# Patient Record
Sex: Male | Born: 1975 | Race: White | Hispanic: No | Marital: Single
Health system: Southern US, Community
[De-identification: ages and names within clinical notes are randomized; demographics above are authoritative.]

---

## 1998-09-04 ENCOUNTER — Emergency Department (HOSPITAL_COMMUNITY): Admission: EM | Admit: 1998-09-04 | Discharge: 1998-09-04 | Payer: Self-pay

## 1998-09-06 ENCOUNTER — Encounter: Admission: RE | Admit: 1998-09-06 | Discharge: 1998-09-06 | Payer: Self-pay | Admitting: *Deleted

## 2000-02-27 ENCOUNTER — Encounter: Payer: Self-pay | Admitting: Emergency Medicine

## 2000-02-27 ENCOUNTER — Emergency Department (HOSPITAL_COMMUNITY): Admission: EM | Admit: 2000-02-27 | Discharge: 2000-02-27 | Payer: Self-pay | Admitting: Emergency Medicine

## 2000-06-17 ENCOUNTER — Emergency Department (HOSPITAL_COMMUNITY): Admission: EM | Admit: 2000-06-17 | Discharge: 2000-06-17 | Payer: Self-pay | Admitting: *Deleted

## 2000-06-17 ENCOUNTER — Encounter: Payer: Self-pay | Admitting: Emergency Medicine

## 2000-06-24 ENCOUNTER — Ambulatory Visit (HOSPITAL_BASED_OUTPATIENT_CLINIC_OR_DEPARTMENT_OTHER): Admission: RE | Admit: 2000-06-24 | Discharge: 2000-06-24 | Payer: Self-pay | Admitting: Orthopedic Surgery

## 2000-09-29 ENCOUNTER — Inpatient Hospital Stay (HOSPITAL_COMMUNITY): Admission: EM | Admit: 2000-09-29 | Discharge: 2000-10-01 | Payer: Self-pay | Admitting: Emergency Medicine

## 2000-09-29 ENCOUNTER — Encounter: Payer: Self-pay | Admitting: Anesthesiology

## 2000-09-30 ENCOUNTER — Encounter: Payer: Self-pay | Admitting: General Surgery

## 2006-01-01 ENCOUNTER — Emergency Department (HOSPITAL_COMMUNITY): Admission: EM | Admit: 2006-01-01 | Discharge: 2006-01-01 | Payer: Self-pay | Admitting: *Deleted

## 2008-05-30 IMAGING — CR DG CHEST 2V
2 series · 2 of 2 positions shown · non-contrast
Comparison: None

CLINICAL DATA: Chest pain, fever

CHEST - 2 VIEW:

[view not recorded (1 of 2)]
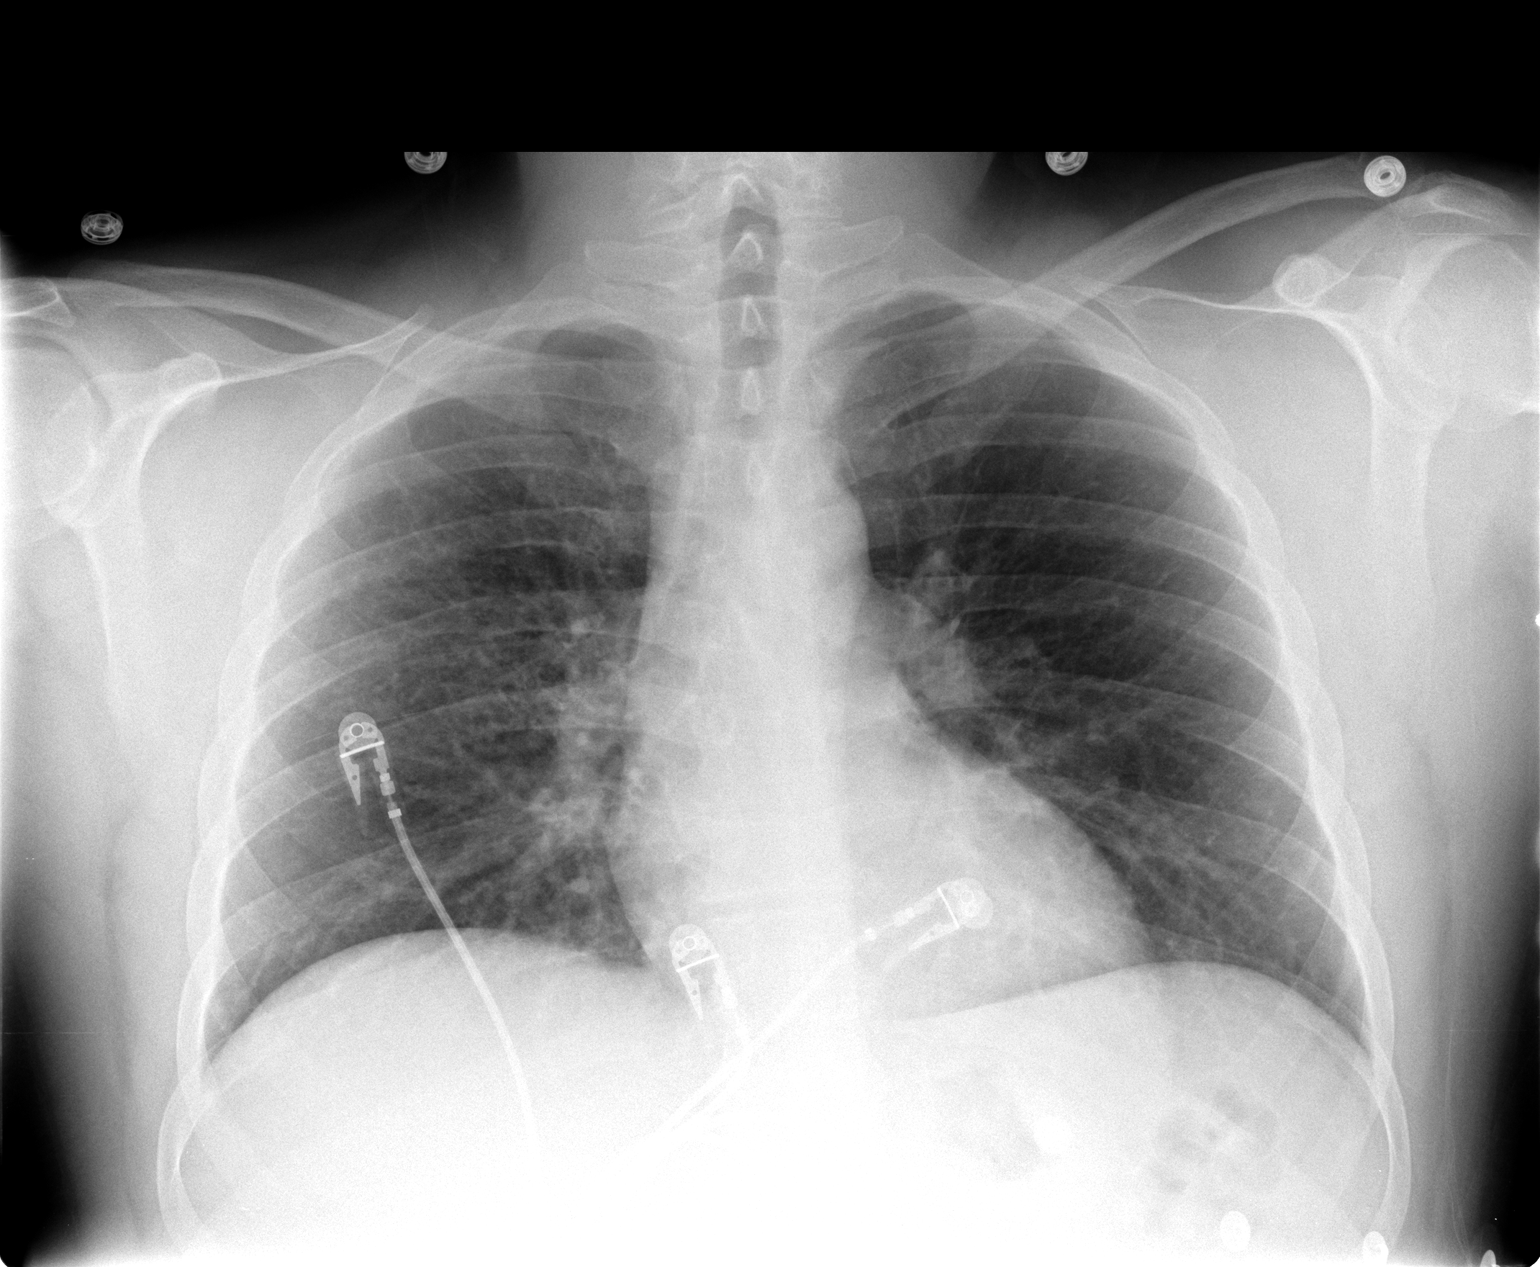

[view not recorded (2 of 2)]
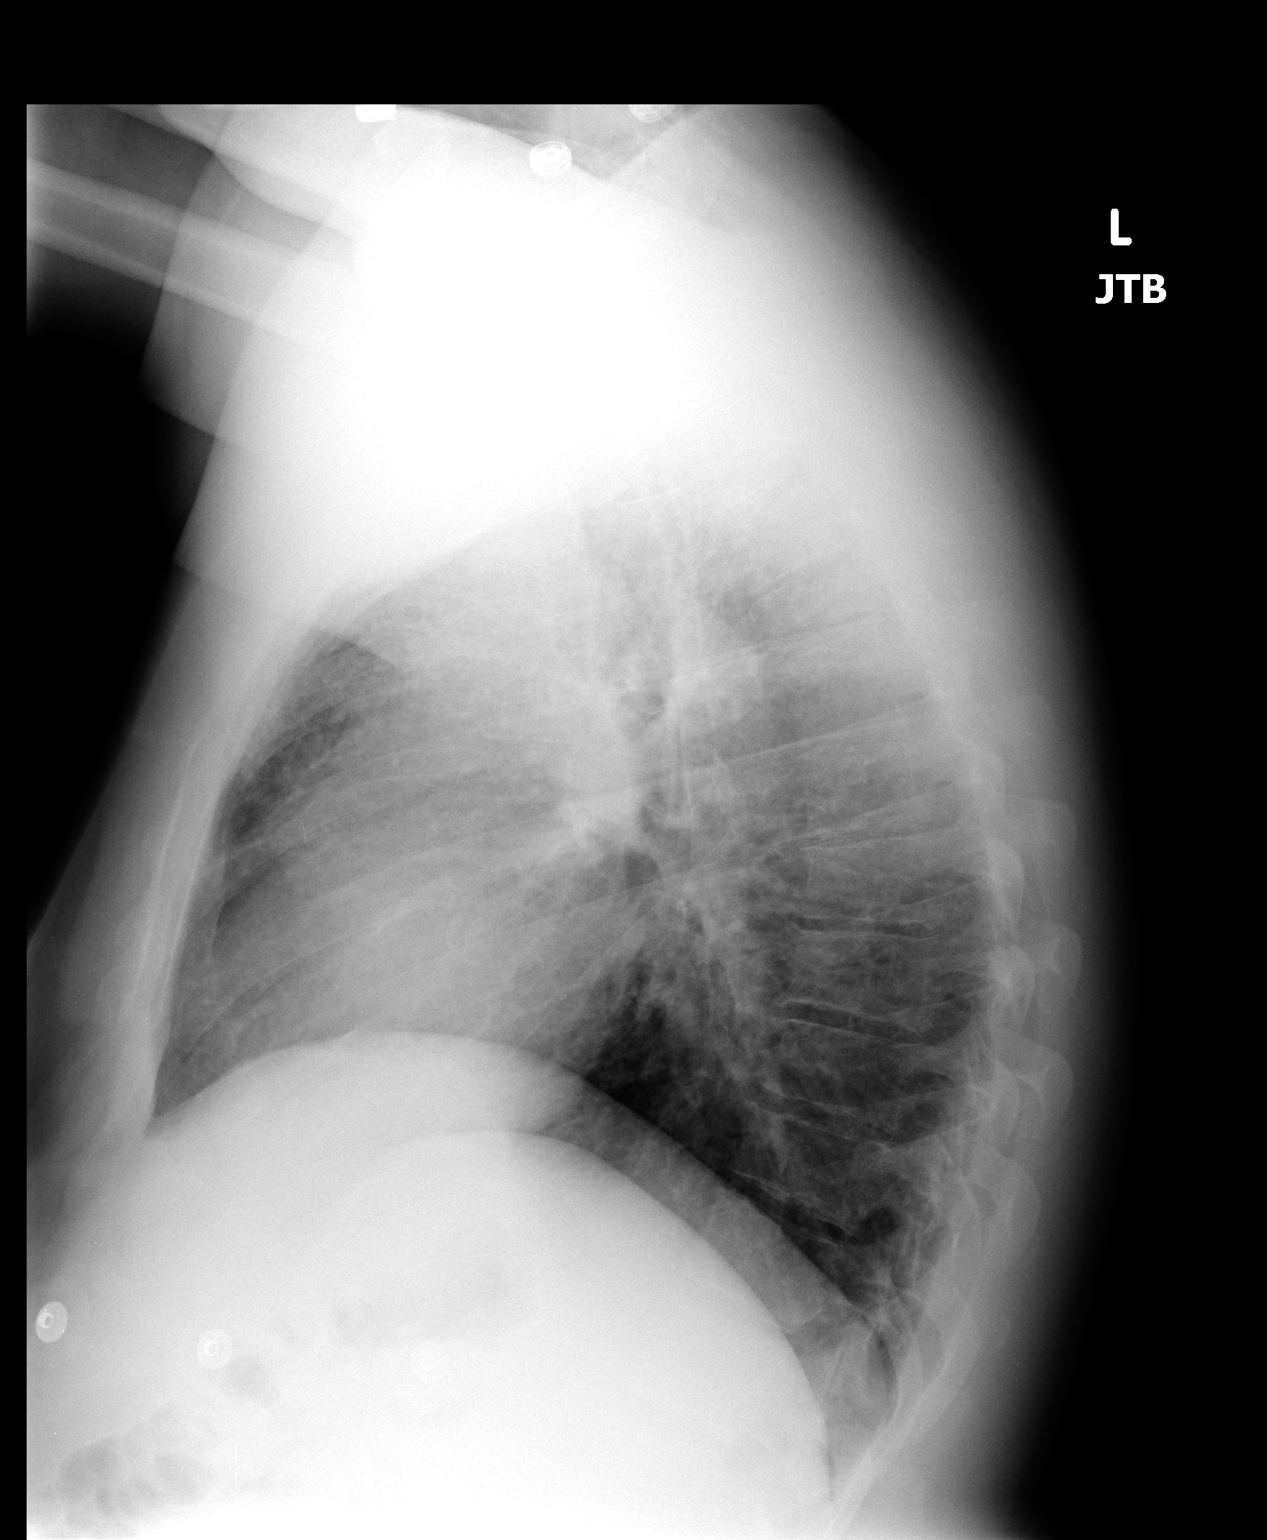

[2 of 2 positions shown; findings below may reference images not displayed]

FINDINGS: There are low lung volumes. Heart is size. There is peribronchial
thickening and interstitial prominence no focal opacities or effusions.
Visualized skeleton unremarkable.
IMPRESSION: Peribronchial thickening and interstitial prominence, presumably related to
bronchitis and/or smoking.

## 2020-04-25 ENCOUNTER — Emergency Department
Admission: EM | Admit: 2020-04-25 | Discharge: 2020-04-25 | Disposition: A | Discharge status: Home / Self Care | Payer: PRIVATE HEALTH INSURANCE | Attending: Emergency Medicine | Admitting: Emergency Medicine

## 2020-04-25 ENCOUNTER — Other Ambulatory Visit: Payer: Self-pay

## 2020-04-25 DIAGNOSIS — S0990XA Unspecified injury of head, initial encounter: Secondary | ICD-10-CM | POA: Insufficient documentation

## 2020-04-25 DIAGNOSIS — S0101XA Laceration without foreign body of scalp, initial encounter: Secondary | ICD-10-CM | POA: Insufficient documentation

## 2020-04-25 DIAGNOSIS — Z23 Encounter for immunization: Secondary | ICD-10-CM | POA: Insufficient documentation

## 2020-04-25 DIAGNOSIS — W001XXA Fall from stairs and steps due to ice and snow, initial encounter: Secondary | ICD-10-CM | POA: Diagnosis not present

## 2020-04-25 MED ORDER — ONDANSETRON 4 MG PO TBDP: 4.0000 mg | ORAL_TABLET | Freq: Three times a day (TID) | ORAL | 0 refills | Status: AC | PRN

## 2020-04-25 MED ORDER — TETANUS-DIPHTH-ACELL PERTUSSIS 5-2.5-18.5 LF-MCG/0.5 IM SUSY
0.5000 mL | PREFILLED_SYRINGE | Freq: Once | INTRAMUSCULAR | Status: AC
  Administered 2020-04-25: 0.5 mL via INTRAMUSCULAR
  Filled 2020-04-25: qty 0.5

## 2020-04-25 MED ORDER — IBUPROFEN 800 MG PO TABS
800.0000 mg | ORAL_TABLET | Freq: Once | ORAL | Status: AC
  Administered 2020-04-25: 800 mg via ORAL
  Filled 2020-04-25: qty 1

## 2020-04-25 MED ORDER — HYDROCODONE-ACETAMINOPHEN 5-325 MG PO TABS: 1.0000 | ORAL_TABLET | Freq: Four times a day (QID) | ORAL | 0 refills | Status: AC | PRN

## 2020-04-25 NOTE — ED Triage Notes (Signed)
Slipped on ice going down brick stairs this AM, causing fall and laceration to posterior head. No LOC. No neck pain or back pain. Laceration visualized and covered at time of triage, oozing noted but no large amount of bleeding. Does not take blood thinners. Pt alert and oriented X4, cooperative, RR even and unlabored, color WNL. Pt in NAD.

## 2020-04-25 NOTE — ED Provider Notes (Signed)
[facility] Emergency Department Provider Note   ____________________________________________   Event Date/Time   First MD Initiated Contact with Patient 04/25/20 [phone]     (approximate)  I have reviewed the triage vital signs and the nursing notes.   HISTORY  Chief Complaint Laceration    HPI Steven Andrews is a 45 y.o. male who presents to the ED from home with a chief complaint of scalp laceration.  Patient slipped on ice going down brick stairs this morning on his way to work.  [place] and struck his head.  Denies LOC.  Does not take anticoagulants.  Denies vision changes, neck pain, chest pain, abdominal pain, nausea, vomiting or dizziness.  Unsure if tetanus is up-to-date.     Past medical history None  There are no problems to display for this patient.   History reviewed. No pertinent surgical history.  Prior to Admission medications   Not on File    Allergies Patient has no known allergies.  No family history on file.  Social History Social History   Substance Use Topics  . Alcohol use: Not Currently    Review of Systems  Constitutional: No fever/chills Eyes: No visual changes. ENT: No sore throat. Cardiovascular: Denies chest pain. Respiratory: Denies shortness of breath. Gastrointestinal: No abdominal pain.  No nausea, no vomiting.  No diarrhea.  No constipation. Genitourinary: Negative for dysuria. Musculoskeletal: Negative for back pain. Skin: Negative for rash. Neurological: Positive for head injury with scalp laceration.  Negative for headaches, focal weakness or numbness.   ____________________________________________   PHYSICAL EXAM:  VITAL SIGNS: ED Triage Vitals  Enc Vitals Group     BP 04/25/20 0533 (!) 156/107     Pulse Rate 04/25/20 0533 89     Resp 04/25/20 0533 18     Temp 04/25/20 0533 98 F (36.7 C)     Temp Source 04/25/20 0533 Oral     SpO2 04/25/20 0533 96 %     Weight 04/25/20 0534 240  lb (108.9 kg)     Height 04/25/20 0534 5\' 10"  (1.778 m)     Head Circumference --      Peak Flow --      Pain Score 04/25/20 0534 7     Pain Loc --      Pain Edu? --      Excl. in GC? --     Constitutional: Alert and oriented. Well appearing and in no acute distress. Eyes: Conjunctivae are normal. PERRL. EOMI. Head: Approximately 5 cm horizontally linear posterior scalp laceration without active bleeding. Nose: Atraumatic. Mouth/Throat: Mucous membranes are moist.  No dental malocclusion.   Neck: No stridor.  No cervical spine tenderness to palpation. Cardiovascular: Normal rate, regular rhythm. Grossly normal heart sounds.  Good peripheral circulation. Respiratory: Normal respiratory effort.  No retractions. Lungs CTAB. Gastrointestinal: Soft and nontender. No distention. No abdominal bruits. No CVA tenderness. Musculoskeletal: No lower extremity tenderness nor edema.  No joint effusions. Neurologic: Alert and oriented x3.  CN II to XII grossly intact.  Normal speech and language. No gross focal neurologic deficits are appreciated. No gait instability. Skin:  Skin is warm, dry and intact. No rash noted. Psychiatric: Mood and affect are normal. Speech and behavior are normal.  ____________________________________________   LABS (all labs ordered are listed, but only abnormal results are displayed)  Labs Reviewed - No data to display ____________________________________________  EKG  None ____________________________________________  RADIOLOGY I, Makyi Ledo J, personally viewed and evaluated these images (plain  radiographs) as part of my medical decision making, as well as reviewing the written report by the radiologist.  ED MD interpretation: None  Official radiology report(s): No results found.  ____________________________________________   PROCEDURES  Procedure(s) performed (including Critical Care):  Marland KitchenMarland KitchenLaceration Repair  Date/Time: 04/25/2020 6:04 AM Performed  by: Irean Hong, MD Authorized by: Irean Hong, MD   Consent:    Consent obtained:  Verbal   Consent given by:  Patient   Risks discussed:  Infection, pain, poor cosmetic result and poor wound healing Anesthesia:    Anesthesia method:  Local infiltration   Local anesthetic:  Lidocaine 1% w/o epi Laceration details:    Location:  Scalp   Scalp location:  Occipital   Length (cm):  5   Depth (mm):  2 Exploration:    Hemostasis achieved with:  Direct pressure   Contaminated: no   Treatment:    Area cleansed with:  Saline   Amount of cleaning:  Standard   Irrigation solution:  Sterile saline   Irrigation method:  Syringe   Visualized foreign bodies/material removed: no   Skin repair:    Repair method:  Staples   Number of staples:  5 Approximation:    Approximation:  Loose Repair type:    Repair type:  Simple Post-procedure details:    Procedure completion:  Tolerated well, no immediate complications     ____________________________________________   INITIAL IMPRESSION / ASSESSMENT AND PLAN / ED COURSE  As part of my medical decision making, I reviewed the following data within the electronic MEDICAL RECORD NUMBER Nursing notes reviewed and incorporated, Notes from prior ED visits and [place] Controlled Substance Database     45 year old male who presents with minor head injury and scalp laceration status post mechanical fall on ice.  He is neurologically intact without focal deficits.  Tolerated staple repair well.  Strict return precautions given.  Patient verbalizes understanding agrees with plan of care.      ____________________________________________   FINAL CLINICAL IMPRESSION(S) / ED DIAGNOSES  Final diagnoses:  Injury of head, initial encounter  Scalp laceration, initial encounter     ED Discharge Orders    None      *Please note:  AMROM ORE was evaluated in Emergency Department on 04/25/2020 for the symptoms described in the history of present  illness. He was evaluated in the context of the global COVID-19 pandemic, which necessitated consideration that the patient might be at risk for infection with the SARS-CoV-2 virus that causes COVID-19. Institutional protocols and algorithms that pertain to the evaluation of patients at risk for COVID-19 are in a state of rapid change based on information released by regulatory bodies including the CDC and federal and state organizations. These policies and algorithms were followed during the patient's care in the ED.  Some ED evaluations and interventions may be delayed as a result of limited staffing during and the pandemic.*   Note:  This document was prepared using Dragon voice recognition software and may include unintentional dictation errors.   Irean Hong, MD 04/25/20 [phone]

## 2020-04-25 NOTE — Discharge Instructions (Addendum)
Staple removal in 7 to 10 days.  Your tetanus has been updated and will be good for 10 years.  You may take ibuprofen as needed for pain, Norco as needed for more severe pain.  You may take Zofran as needed for nausea.  Return to the ER for worsening symptoms, persistent vomiting, lethargy or other concerns.
